# Patient Record
Sex: Male | Born: 1985 | Race: White | Hispanic: No | Smoking: Never smoker
Health system: Southern US, Community
[De-identification: ages and names within clinical notes are randomized; demographics above are authoritative.]

## PROBLEM LIST (undated history)

## (undated) DIAGNOSIS — J45909 Unspecified asthma, uncomplicated: Secondary | ICD-10-CM

---

## 2016-01-18 ENCOUNTER — Emergency Department (HOSPITAL_COMMUNITY)
Admission: EM | Admit: 2016-01-18 | Discharge: 2016-01-18 | Disposition: A | Discharge status: Home / Self Care | Payer: BLUE CROSS/BLUE SHIELD | Attending: Emergency Medicine | Admitting: Emergency Medicine

## 2016-01-18 ENCOUNTER — Encounter (HOSPITAL_COMMUNITY): Payer: Self-pay | Admitting: Emergency Medicine

## 2016-01-18 DIAGNOSIS — Z79899 Other long term (current) drug therapy: Secondary | ICD-10-CM | POA: Diagnosis not present

## 2016-01-18 DIAGNOSIS — Y9367 Activity, basketball: Secondary | ICD-10-CM | POA: Insufficient documentation

## 2016-01-18 DIAGNOSIS — Y92310 Basketball court as the place of occurrence of the external cause: Secondary | ICD-10-CM | POA: Diagnosis not present

## 2016-01-18 DIAGNOSIS — S0501XA Injury of conjunctiva and corneal abrasion without foreign body, right eye, initial encounter: Secondary | ICD-10-CM

## 2016-01-18 DIAGNOSIS — J45909 Unspecified asthma, uncomplicated: Secondary | ICD-10-CM | POA: Insufficient documentation

## 2016-01-18 DIAGNOSIS — H1131 Conjunctival hemorrhage, right eye: Secondary | ICD-10-CM | POA: Diagnosis not present

## 2016-01-18 DIAGNOSIS — S0511XA Contusion of eyeball and orbital tissues, right eye, initial encounter: Secondary | ICD-10-CM | POA: Diagnosis not present

## 2016-01-18 DIAGNOSIS — Y998 Other external cause status: Secondary | ICD-10-CM | POA: Diagnosis not present

## 2016-01-18 DIAGNOSIS — W228XXA Striking against or struck by other objects, initial encounter: Secondary | ICD-10-CM | POA: Diagnosis not present

## 2016-01-18 DIAGNOSIS — S0591XA Unspecified injury of right eye and orbit, initial encounter: Secondary | ICD-10-CM | POA: Diagnosis present

## 2016-01-18 HISTORY — DX: Unspecified asthma, uncomplicated: J45.909

## 2016-01-18 MED ORDER — TETRACAINE HCL 0.5 % OP SOLN
2.0000 [drp] | Freq: Once | OPHTHALMIC | Status: AC
  Administered 2016-01-18: 2 [drp] via OPHTHALMIC
  Filled 2016-01-18: qty 2

## 2016-01-18 MED ORDER — IBUPROFEN 800 MG PO TABS: 800.0000 mg | ORAL_TABLET | Freq: Three times a day (TID) | ORAL | Status: AC

## 2016-01-18 MED ORDER — FLUORESCEIN SODIUM 1 MG OP STRP
1.0000 | ORAL_STRIP | Freq: Once | OPHTHALMIC | Status: AC
  Administered 2016-01-18: 1 via OPHTHALMIC
  Filled 2016-01-18: qty 1

## 2016-01-18 MED ORDER — ERYTHROMYCIN 5 MG/GM OP OINT: TOPICAL_OINTMENT | OPHTHALMIC | Status: AC

## 2016-01-18 NOTE — Discharge Instructions (Signed)
Please call Dr. Darrol Poke office to schedule a follow up appointment as soon as possible within the next 24-48 hours. Use the antibiotic ointment as directed. You may take ibuprofen as needed for pain. Return to the ER for new or worsening symptoms.   Corneal Abrasion The cornea is the clear covering at the front and center of the eye. When looking at the colored portion of the eye (iris), you are looking through the cornea. This very thin tissue is made up of many layers. The surface layer is a single layer of cells (corneal epithelium) and is one of the most sensitive tissues in the body. If a scratch or injury causes the corneal epithelium to come off, it is called a corneal abrasion. If the injury extends to the tissues below the epithelium, the condition is called a corneal ulcer. CAUSES   Scratches.  Trauma.  Foreign body in the eye. Some people have recurrences of abrasions in the area of the original injury even after it has healed (recurrent erosion syndrome). Recurrent erosion syndrome generally improves and goes away with time. SYMPTOMS   Eye pain.  Difficulty or inability to keep the injured eye open.  The eye becomes very sensitive to light.  Recurrent erosions tend to happen suddenly, first thing in the morning, usually after waking up and opening the eye. DIAGNOSIS  Your health care provider can diagnose a corneal abrasion during an eye exam. Dye is usually placed in the eye using a drop or a small paper strip moistened by your tears. When the eye is examined with a special light, the abrasion shows up clearly because of the dye. TREATMENT   Small abrasions may be treated with antibiotic drops or ointment alone.  A pressure patch may be put over the eye. If this is done, follow your doctor's instructions for when to remove the patch. Do not drive or use machines while the eye patch is on. Judging distances is hard to do with a patch on. If the abrasion becomes infected and  spreads to the deeper tissues of the cornea, a corneal ulcer can result. This is serious because it can cause corneal scarring. Corneal scars interfere with light passing through the cornea and cause a loss of vision in the involved eye. HOME CARE INSTRUCTIONS  Use medicine or ointment as directed. Only take over-the-counter or prescription medicines for pain, discomfort, or fever as directed by your health care provider.  Do not drive or operate machinery if your eye is patched. Your ability to judge distances is impaired.  If your health care provider has given you a follow-up appointment, it is very important to keep that appointment. Not keeping the appointment could result in a severe eye infection or permanent loss of vision. If there is any problem keeping the appointment, let your health care provider know. SEEK MEDICAL CARE IF:   You have pain, light sensitivity, and a scratchy feeling in one eye or both eyes.  Your pressure patch keeps loosening up, and you can blink your eye under the patch after treatment.  Any kind of discharge develops from the eye after treatment or if the lids stick together in the morning.  You have the same symptoms in the morning as you did with the original abrasion days, weeks, or months after the abrasion healed.   This information is not intended to replace advice given to you by your health care provider. Make sure you discuss any questions you have with your health care  provider.   Document Released: 10/11/2000 Document Revised: 07/05/2015 Document Reviewed: 06/21/2013 Elsevier Interactive Patient Education 2016 Elsevier Inc.  Subconjunctival Hemorrhage Subconjunctival hemorrhage is bleeding that happens between the white part of your eye (sclera) and the clear membrane that covers the outside of your eye (conjunctiva). There are many tiny blood vessels near the surface of your eye. A subconjunctival hemorrhage happens when one or more of these  vessels breaks and bleeds, causing a red patch to appear on your eye. This is similar to a bruise. Depending on the amount of bleeding, the red patch may only cover a small area of your eye or it may cover the entire visible part of the sclera. If a lot of blood collects under the conjunctiva, there may also be swelling. Subconjunctival hemorrhages do not affect your vision or cause pain, but your eye may feel irritated if there is swelling. Subconjunctival hemorrhages usually do not require treatment, and they disappear on their own within two weeks. CAUSES This condition may be caused by:  Mild trauma, such as rubbing your eye too hard.  Severe trauma or blunt injuries.  Coughing, sneezing, or vomiting.  Straining, such as when lifting a heavy object.  High blood pressure.  Recent eye surgery.  A history of diabetes.  Certain medicines, especially blood thinners (anticoagulants).  Other conditions, such as eye tumors, bleeding disorders, or blood vessel abnormalities. Subconjunctival hemorrhages can happen without an obvious cause.  SYMPTOMS  Symptoms of this condition include:  A bright red or dark red patch on the white part of the eye.  The red area may spread out to cover a larger area of the eye before it goes away.  The red area may turn brownish-yellow before it goes away.  Swelling.  Mild eye irritation. DIAGNOSIS This condition is diagnosed with a physical exam. If your subconjunctival hemorrhage was caused by trauma, your health care provider may refer you to an eye specialist (ophthalmologist) or another specialist to check for other injuries. You may have other tests, including:  An eye exam.  A blood pressure check.  Blood tests to check for bleeding disorders. If your subconjunctival hemorrhage was caused by trauma, X-rays or a CT scan may be done to check for other injuries. TREATMENT Usually, no treatment is needed. Your health care provider may  recommend eye drops or cold compresses to help with discomfort. HOME CARE INSTRUCTIONS  Take over-the-counter and prescription medicines only as directed by your health care provider.  Use eye drops or cold compresses to help with discomfort as directed by your health care provider.  Avoid activities, things, and environments that may irritate or injure your eye.  Keep all follow-up visits as told by your health care provider. This is important. SEEK MEDICAL CARE IF:  You have pain in your eye.  The bleeding does not go away within 3 weeks.  You keep getting new subconjunctival hemorrhages. SEEK IMMEDIATE MEDICAL CARE IF:  Your vision changes or you have difficulty seeing.  You suddenly develop severe sensitivity to light.  You develop a severe headache, persistent vomiting, confusion, or abnormal tiredness (lethargy).  Your eye seems to bulge or protrude from your eye socket.  You develop unexplained bruises on your body.  You have unexplained bleeding in another area of your body.   This information is not intended to replace advice given to you by your health care provider. Make sure you discuss any questions you have with your health care provider.   Document  Released: 10/14/2005 Document Revised: 07/05/2015 Document Reviewed: 12/21/2014 Elsevier Interactive Patient Education [employer]2016 Elsevier Inc.

## 2016-01-18 NOTE — ED Provider Notes (Signed)
CSN: [id number]     Arrival date & time 01/18/16  2132 History  By signing my name below, I, Doreatha Martin, attest that this documentation has been prepared under the direction and in the presence of Kainan Patty Y Aradhya Shellenbarger, [place]. Electronically Signed: Doreatha Martin, ED Scribe. 01/18/2016. 9:46 PM.     Chief Complaint  Patient presents with  . Eye Injury   The history is provided by the patient. No language interpreter was used.   HPI Comments: Kerry Hall is a 30 y.o. male who presents to the Emergency Department complaining of moderate right eye pain and eyelid swelling onset this evening s/p injury with associated blurred vision, wateriness. Per pt, he was playing basketball when he accidentally hit his eye with thumb. Pt states he has throbbing pain with ocular movement. Pt attributes his blurred vision to his tearing. He does not wear contacts or glasses. Pt denies taking OTC medications at home to improve symptoms. He denies additional injury.  Past Medical History  Diagnosis Date  . Asthma    History reviewed. No pertinent past surgical history. No family history on file. Social History  Substance Use Topics  . Smoking status: Never Smoker   . Smokeless tobacco: None  . Alcohol Use: Yes    Review of Systems  Eyes: Positive for pain, discharge and visual disturbance.  All other systems reviewed and are negative.  Allergies  Review of patient's allergies indicates no known allergies.  Home Medications   Prior to Admission medications   Medication Sig Start Date End Date Taking? Authorizing Provider  omeprazole (PRILOSEC) 40 MG capsule Take 40 mg by mouth daily.   Yes Historical Provider, MD   BP 115/74 mmHg  Pulse 97  Temp(Src) 98.7 F (37.1 C) (Oral)  Resp 16  Ht  (1.88 m)  Wt 179 lb (81.194 kg)  BMI 22.97 kg/m2  SpO2 100% Physical Exam  Constitutional: He is oriented to person, place, and time. He appears well-developed and well-nourished.  HENT:  Head:  Normocephalic and atraumatic.  Eyes: Conjunctivae and EOM are normal. Pupils are equal, round, and reactive to light.  Slit lamp exam:      The right eye shows corneal abrasion.    Three corneal abrasions transversing anterior aspect of right eye. One crosses the pupil.  Right subconjunctival hemorrhage  Neck: Normal range of motion. Neck supple.  Cardiovascular: Normal rate.   Pulmonary/Chest: Effort normal. No respiratory distress.  Abdominal: He exhibits no distension.  Musculoskeletal: Normal range of motion.  Neurological: He is alert and oriented to person, place, and time.  Skin: Skin is warm and dry.  Psychiatric: He has a normal mood and affect. His behavior is normal.  Nursing note and vitals reviewed.    Visual Acuity  Right Eye Distance:   Left Eye Distance:   Bilateral Distance:    Right Eye Near: R Near: 20/20 Left Eye Near:  L Near: 20/20 Bilateral Near:  20/20   ED Course  Procedures (including critical care time) DIAGNOSTIC STUDIES: Oxygen Saturation is 100% on RA, normal by my interpretation.    COORDINATION OF CARE: 9:45 PM Discussed treatment plan with pt at bedside which includes symptomatic treatment and pt agreed to plan.    MDM   Final diagnoses:  Corneal abrasion, right, initial encounter  Contusion of right eye, initial encounter  Subconjunctival hemorrhage of right eye    Pt with corneal abrasion and subconjunctival hemorrhage on exam. Visual acuity and EOM intact. Rx given  for erythromycin ointment. Instructed to call ophtho to schedule f/u ASAP within next 24-48h for re-eval. Ibuprofen as needed for pain. ER return precautions given. Pt verbalized his agreement and understanding with plan.  I personally performed the services described in this documentation, which was scribed in my presence. The recorded information has been reviewed and is accurate.   Carlene CoriaSerena Y Natavia Sublette, PA-C 01/18/16 2255  Loren Raceravid Yelverton, MD 01/19/16 [id number]

## 2016-01-18 NOTE — ED Notes (Signed)
Pt. accidentally hit his right eye with his finger while playing basketball this evening , reports pain with redness/mild swelling , no vision loss .

## 2016-04-17 ENCOUNTER — Other Ambulatory Visit: Payer: Self-pay | Admitting: Otolaryngology

## 2016-04-17 DIAGNOSIS — K219 Gastro-esophageal reflux disease without esophagitis: Secondary | ICD-10-CM

## 2016-04-19 ENCOUNTER — Ambulatory Visit
Admission: RE | Admit: 2016-04-19 | Discharge: 2016-04-19 | Disposition: A | Discharge status: Home / Self Care | Payer: BC Managed Care – PPO | Source: Ambulatory Visit | Attending: Otolaryngology | Admitting: Otolaryngology

## 2016-04-19 DIAGNOSIS — K219 Gastro-esophageal reflux disease without esophagitis: Secondary | ICD-10-CM

## 2016-05-08 ENCOUNTER — Other Ambulatory Visit: Payer: Self-pay | Admitting: Otolaryngology

## 2016-05-08 DIAGNOSIS — K2100 Gastro-esophageal reflux disease with esophagitis, without bleeding: Secondary | ICD-10-CM

## 2016-05-15 ENCOUNTER — Ambulatory Visit
Admission: RE | Admit: 2016-05-15 | Discharge: 2016-05-15 | Disposition: A | Discharge status: Home / Self Care | Payer: BC Managed Care – PPO | Source: Ambulatory Visit | Attending: Otolaryngology | Admitting: Otolaryngology

## 2016-05-15 DIAGNOSIS — K2100 Gastro-esophageal reflux disease with esophagitis, without bleeding: Secondary | ICD-10-CM

## 2016-05-15 MED ORDER — IOPAMIDOL (ISOVUE-300) INJECTION 61%
75.0000 mL | Freq: Once | INTRAVENOUS | Status: AC | PRN
  Administered 2016-05-15: 75 mL via INTRAVENOUS

## 2016-10-12 IMAGING — CT CT NECK W/ CM
4 of 5 series · 16 of 33 positions shown, 19 images · IV contrast (75CC ISOVUE 300)
Comparison: None.

CLINICAL DATA: Gastroesophageal reflux disease with esophagitis.
Dysphasia.

EXAM:
CT NECK WITH CONTRAST
TECHNIQUE: Multidetector CT imaging of the neck was performed using the
standard protocol following the bolus administration of intravenous
contrast.
CONTRAST:  75mL 1KLEE4-6DD IOPAMIDOL (1KLEE4-6DD) INJECTION 61%

[Series 3: axial neck · axial · 0.45mm/px · z∈[-55,+55]mm · 3 of 112 slices shown]
[im 23/112  bone]
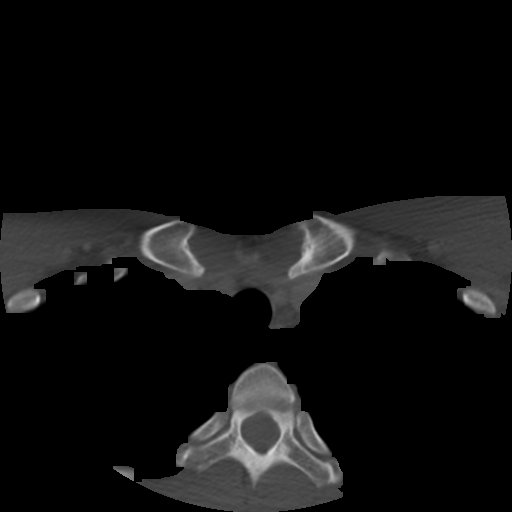
[im 45/112  bone]
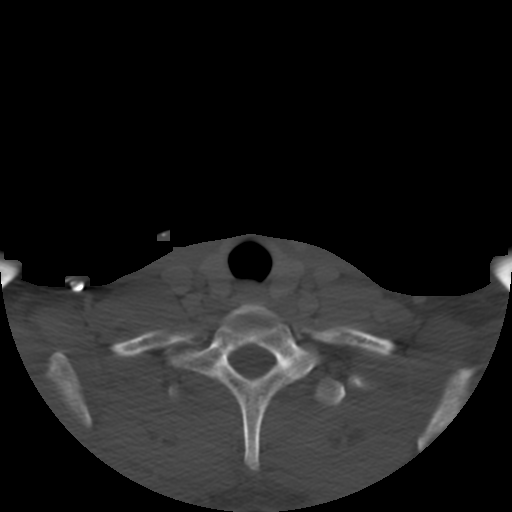
[im 67/112  bone]
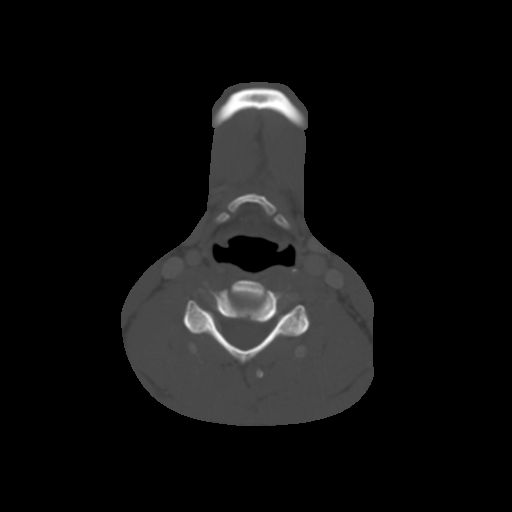

[Series 200: cor · coronal · 0.54mm/px · 3 of 112 slices shown]
[im 23/112  bone]
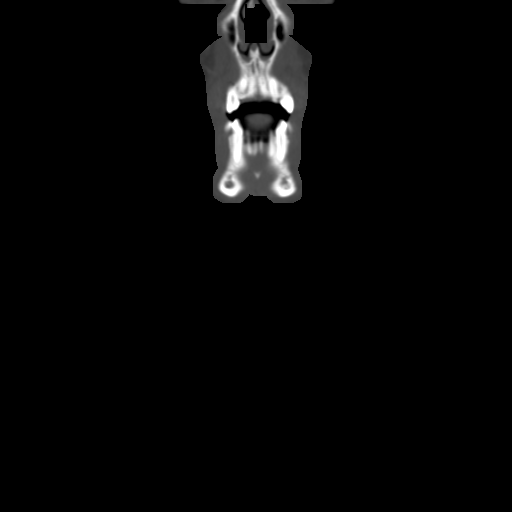
[im 45/112  bone]
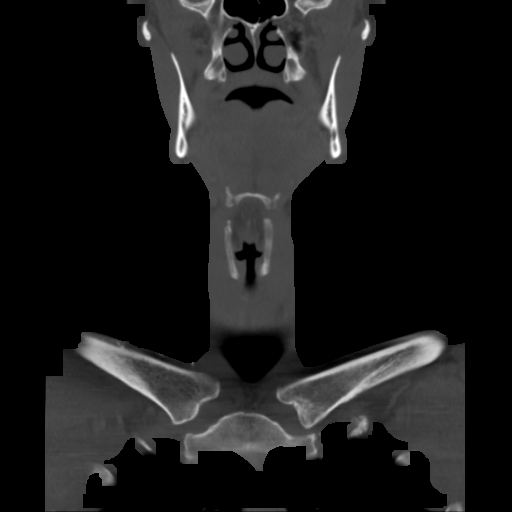
[im 67/112  bone]
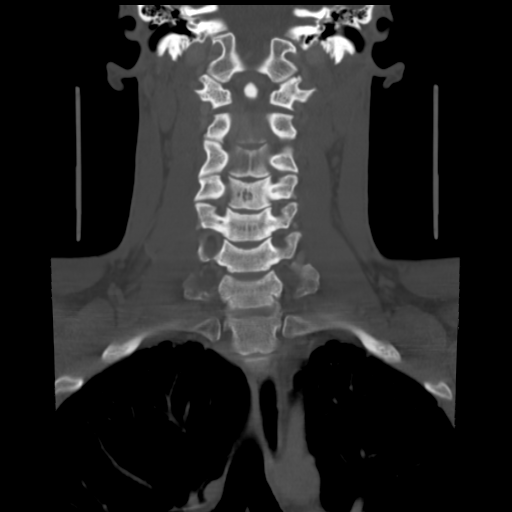

[Series 201: angled axial · axial · 0.54mm/px · z∈[-100,+98]mm · 5 of 153 slices shown, 7 images]
[im 26/153  soft-tissue]
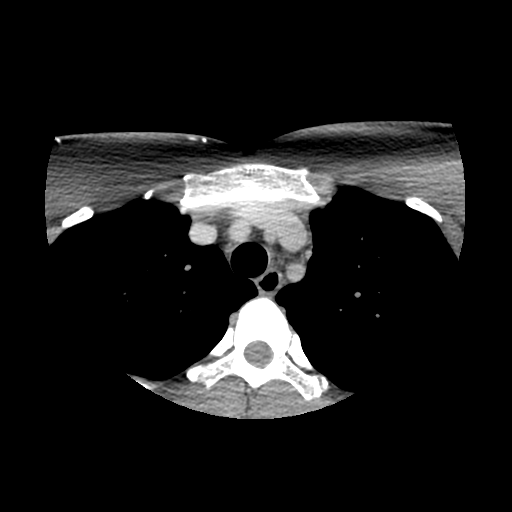
[im 26/153  bone]
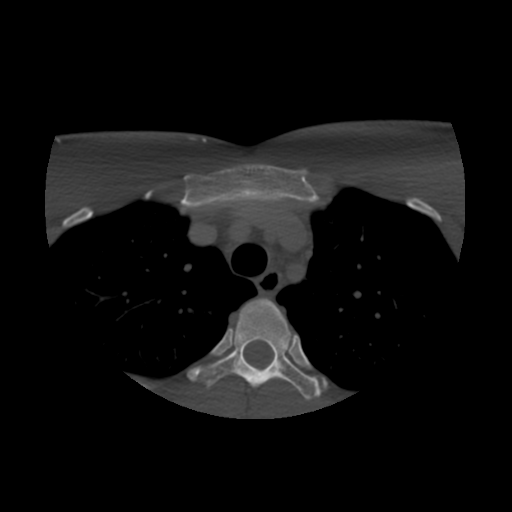
[im 51/153  bone]
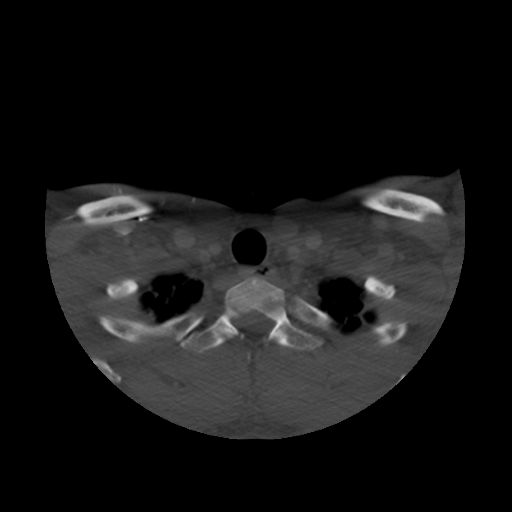
[im 77/153  bone]
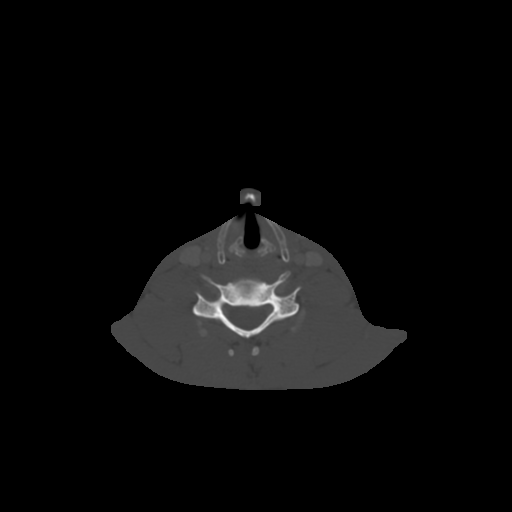
[im 102/153  bone]
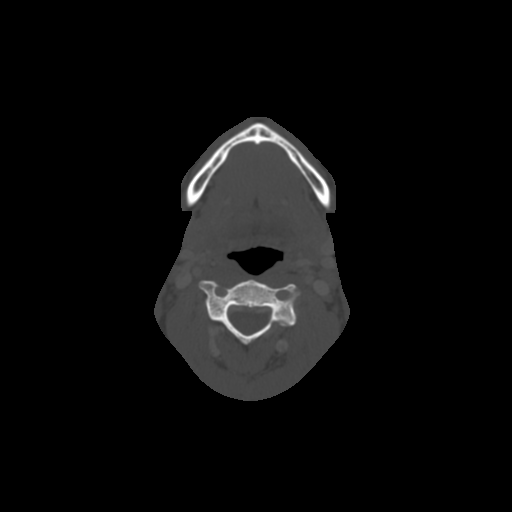
[im 127/153  soft-tissue]
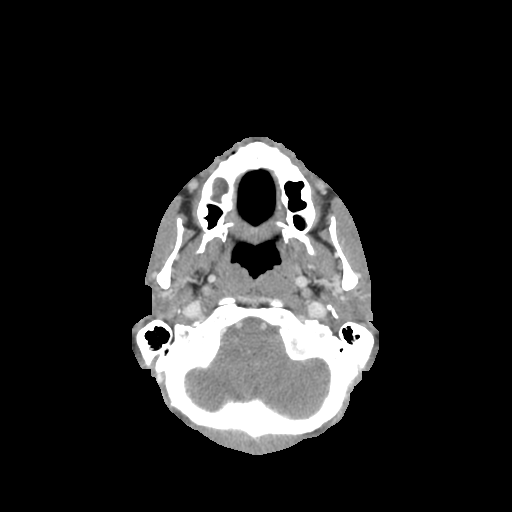
[im 127/153  bone]
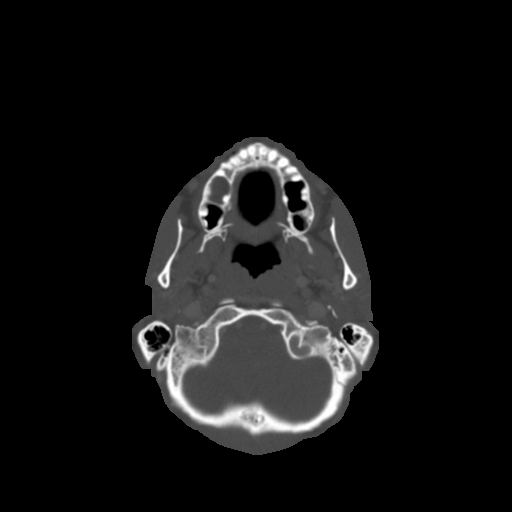

[Series 202: sag · sagittal · 0.54mm/px · 5 of 115 slices shown, 6 images]
[im 39/115  bone]
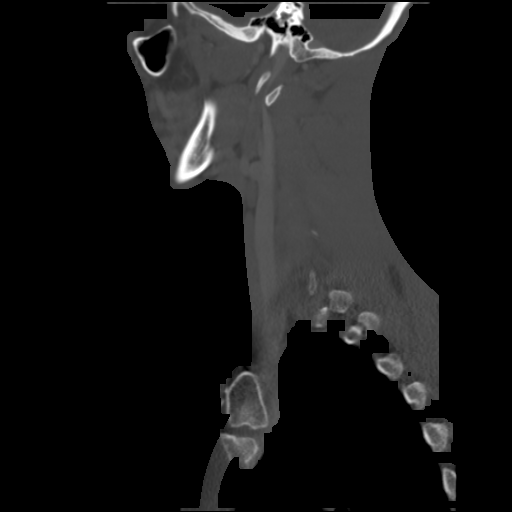
[im 48/115  bone]
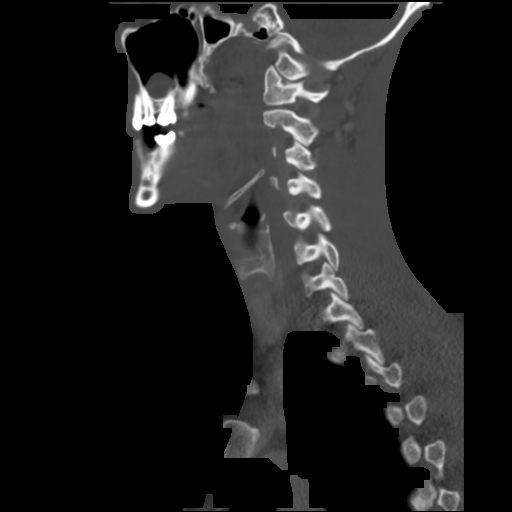
[im 58/115  soft-tissue]
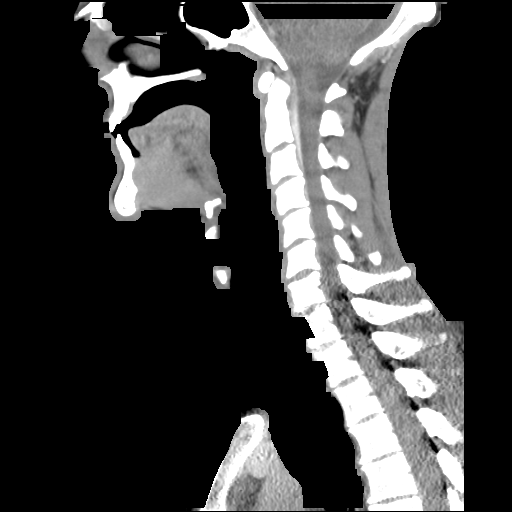
[im 58/115  bone]
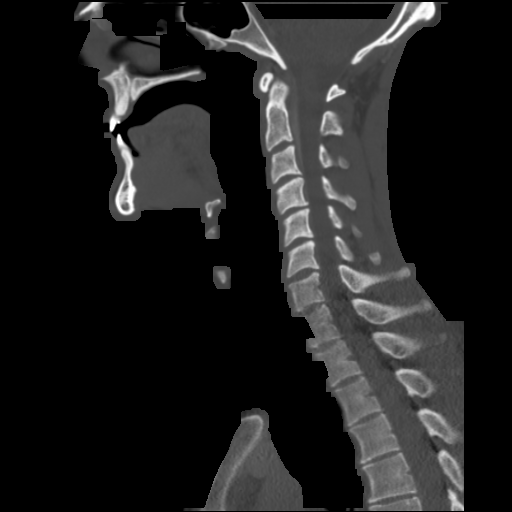
[im 67/115  bone]
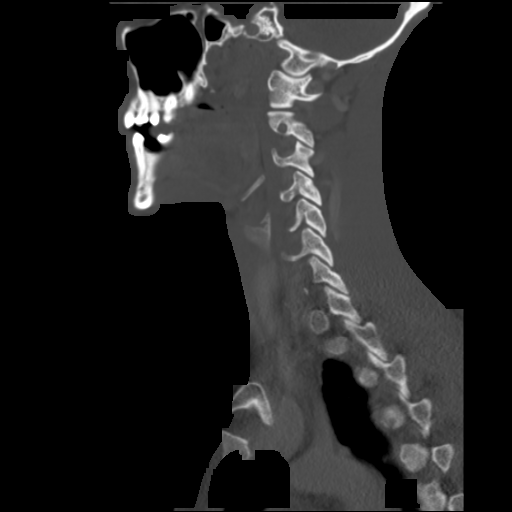
[im 77/115  bone]
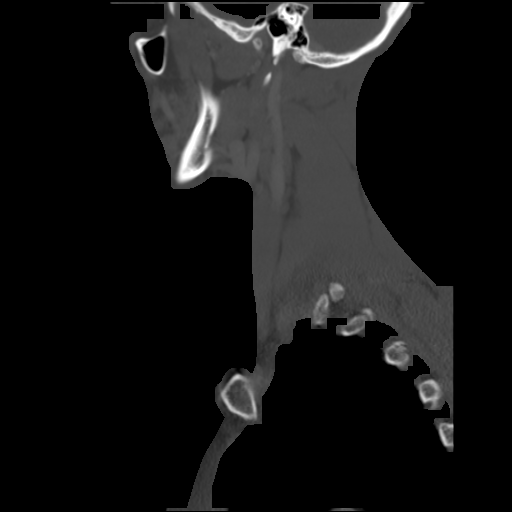

[16 of 33 positions shown; findings below may reference images not displayed]

FINDINGS: Pharynx and larynx: The nasopharynx is clear. The oropharynx and
hypopharynx are normal. The epiglottis is normal. The supraglottic
larynx, glottis and subglottic larynx are normal.

Salivary glands: The parotid and submandibular glands are normal. No
sialolithiasis or salivary ductal dilatation.

Thyroid: Normal.

Lymph nodes: No cervical lymphadenopathy.

Vascular: Normal

Limited intracranial: Normal

Visualized orbits: Normal.

Mastoids and visualized paranasal sinuses: Small right maxillary
retention cyst, in close proximity to the root of 1 of the right
molars.

Skeleton: Normal

Upper chest: Normal
IMPRESSION: 1. Normal neck CT.
2. No focal abnormality to explain the reported dysphagia.

## 2019-11-10 DIAGNOSIS — Z20828 Contact with and (suspected) exposure to other viral communicable diseases: Secondary | ICD-10-CM | POA: Diagnosis not present

## 2019-11-25 DIAGNOSIS — L309 Dermatitis, unspecified: Secondary | ICD-10-CM | POA: Diagnosis not present

## 2019-11-25 DIAGNOSIS — D224 Melanocytic nevi of scalp and neck: Secondary | ICD-10-CM | POA: Diagnosis not present

## 2019-11-25 DIAGNOSIS — D485 Neoplasm of uncertain behavior of skin: Secondary | ICD-10-CM | POA: Diagnosis not present

## 2019-11-25 DIAGNOSIS — D225 Melanocytic nevi of trunk: Secondary | ICD-10-CM | POA: Diagnosis not present

## 2019-11-25 DIAGNOSIS — D2262 Melanocytic nevi of left upper limb, including shoulder: Secondary | ICD-10-CM | POA: Diagnosis not present

## 2019-12-28 DIAGNOSIS — Z8249 Family history of ischemic heart disease and other diseases of the circulatory system: Secondary | ICD-10-CM | POA: Diagnosis not present

## 2019-12-28 DIAGNOSIS — Z0000 Encounter for general adult medical examination without abnormal findings: Secondary | ICD-10-CM | POA: Diagnosis not present

## 2024-04-13 ENCOUNTER — Inpatient Hospital Stay: Payer: Self-pay

## 2024-04-13 ENCOUNTER — Inpatient Hospital Stay: Payer: Self-pay | Attending: Internal Medicine | Admitting: Genetic Counselor

## 2024-04-13 ENCOUNTER — Encounter: Payer: Self-pay | Admitting: Genetic Counselor

## 2024-04-13 ENCOUNTER — Other Ambulatory Visit: Payer: Self-pay | Admitting: Genetic Counselor

## 2024-04-13 DIAGNOSIS — Z1379 Encounter for other screening for genetic and chromosomal anomalies: Secondary | ICD-10-CM

## 2024-04-13 DIAGNOSIS — Z8041 Family history of malignant neoplasm of ovary: Secondary | ICD-10-CM

## 2024-04-13 DIAGNOSIS — Z800 Family history of malignant neoplasm of digestive organs: Secondary | ICD-10-CM

## 2024-04-13 LAB — GENETIC SCREENING ORDER

## 2024-04-13 NOTE — Progress Notes (Signed)
 REFERRING PROVIDER: Perley Bradley, MD [address] [place],  [place] [address]  PRIMARY PROVIDER:  No primary care provider on file.  PRIMARY REASON FOR VISIT:  1. Family history of Lynch syndrome   2. Family history of ovarian cancer    HISTORY OF PRESENT ILLNESS:   Kerry Hall, a 38 y.o. male, was seen for a [facility] cancer genetics consultation at the request of Dr. Annabell Key due to a family history of Lynch Syndrome.  Kerry Hall presents to clinic today to discuss the possibility of a hereditary predisposition to cancer, to discuss genetic testing, and to further clarify his future cancer risks, as well as potential cancer risks for family members.   Kerry Hall is a 38 y.o. male with no personal history of cancer.    Past Medical History:  Diagnosis Date   Asthma     Social History   Socioeconomic History   Marital status: Unknown    Spouse name: Not on file   Number of children: Not on file   Years of education: Not on file   Highest education level: Not on file  Occupational History   Not on file  Tobacco Use   Smoking status: Never   Smokeless tobacco: Not on file  Substance and Sexual Activity   Alcohol use: Yes   Drug use: No   Sexual activity: Not on file  Other Topics Concern   Not on file  Social History Narrative   Not on file   Social Drivers of Health   Financial Resource Strain: Not on file  Food Insecurity: Not on file  Transportation Needs: Not on file  Physical Activity: Not on file  Stress: Not on file  Social Connections: Not on file     FAMILY HISTORY:  We obtained a detailed, 4-generation family history.  Significant diagnoses are listed below: Family History  Problem Relation Age of Onset   Other Mother 50       benign brain tumor   Ovarian cancer Sister 76 - 75       lynch syndrome (reportedly)   Lung cancer Maternal Aunt 93 - 58       smoked   Lung cancer Paternal Grandmother 75 - 22       nonsmoker   Other Niece         childhood brain tumor     Kerry Hall reports his sister was diagnosed with Lynch Syndrome but he does not have a copy of her report nor does he know what gene she tested positive in. There is no reported Ashkenazi Jewish ancestry.  GENETIC COUNSELING ASSESSMENT: Kerry Hall is a 38 y.o. male with a family history of cancer which is somewhat suggestive of a hereditary predisposition to cancer given his sister's reported Lynch Syndrome diagnosis. We, therefore, discussed and recommended the following at today's visit.   DISCUSSION: We discussed that 5 - 10% of cancer is hereditary, with some cases of hereditary ovarian cancer associated with Lynch Syndrome.  There are other genes that can be associated with hereditary ovarian cancer syndromes.  We discussed that testing is beneficial for several reasons, including knowing about other cancer risks, identifying potential screening and risk-reduction options that may be appropriate, and to understanding if other family members could be at risk for cancer and allowing them to undergo genetic testing.  We spent the majority of today's session reviewing Lynch Syndrome with MLH1 as the example. We also reviewed the characteristics, features and inheritance patterns of  hereditary cancer syndromes. We also discussed genetic testing, including the appropriate family members to test, the process of testing, insurance coverage and turn-around-time for results. We discussed the implications of a negative, positive, carrier and/or variant of uncertain significant result. We recommended Kerry Hall pursue genetic testing for a panel that contains genes associated with Lynch Syndrome and common cancers since we do not have a copy of her report.  Kerry Hall was offered a common hereditary cancer panel (40 genes) and an expanded pan-cancer panel (77 genes). Kerry Hall was informed of the benefits and limitations of each panel, including that expanded pan-cancer  panels contain several genes that do not have clear management guidelines at this point in time.  We also discussed that as the number of genes included on a panel increases, the chances of variants of uncertain significance increases.  After considering the benefits and limitations of each gene panel, Kerry Hall elected to have [facility] CancerNext-Expanded Panel.  The CancerNext-Expanded gene panel offered by [facility] and includes sequencing, rearrangement, and RNA analysis for the following 77 genes: AIP, ALK, APC, ATM, AXIN2, BAP1, BARD1, BMPR1A, BRCA1, BRCA2, BRIP1, CDC73, CDH1, CDK4, CDKN1B, CDKN2A, CEBPA, CHEK2, CTNNA1, DDX41, DICER1, ETV6, FH, FLCN, GATA2, LZTR1, MAX, MBD4, MEN1, MET, MLH1, MSH2, MSH3, MSH6, MUTYH, NF1, NF2, NTHL1, PALB2, PHOX2B, PMS2, POT1, PRKAR1A, PTCH1, PTEN, RAD51C, RAD51D, RB1, RET, RPS20, RUNX1, SDHA, SDHAF2, SDHB, SDHC, SDHD, SMAD4, SMARCA4, SMARCB1, SMARCE1, STK11, SUFU, TMEM127, TP53, TSC1, TSC2, VHL, and WT1 (sequencing and deletion/duplication); EGFR, HOXB13, KIT, MITF, PDGFRA, POLD1, and POLE (sequencing only); EPCAM and GREM1 (deletion/duplication only).     Based on Kerry Hall's family history of cancer, he meets medical criteria for genetic testing. Despite that he meets criteria, he may still have an out of pocket cost. We discussed that if his out of pocket cost for testing is over $100, the laboratory should contact them to discuss self-pay prices, patient pay assistance programs, if applicable, and other billing options.  We discussed that some people do not want to undergo genetic testing due to fear of genetic discrimination.  A federal law called the Genetic Information Non-Discrimination Act (GINA) of 2008 helps protect individuals against genetic discrimination based on their genetic test results.  It impacts both health insurance and employment.  With health insurance, it protects against increased premiums, being kicked off insurance or being forced to  take a test in order to be insured.  For employment it protects against hiring, firing and promoting decisions based on genetic test results.  GINA does not apply to those in the [employer], those who work for companies with less than 15 employees, and new life insurance or long-term disability insurance policies.  Health status due to a cancer diagnosis is not protected under GINA.  PLAN: After considering the risks, benefits, and limitations, Kerry Hall provided informed consent to pursue genetic testing and the blood sample was sent to [facility] for analysis of the CancerNext-Expanded Panel. Results should be available within approximately 2-3 weeks' time, at which point they will be disclosed by telephone to Kerry Hall, as will any additional recommendations warranted by these results. Kerry Hall will receive a summary of his genetic counseling visit and a copy of his results once available. This information will also be available in Epic.   Kerry Hall questions were answered to his satisfaction today. Our contact information was provided should additional questions or concerns arise. Thank you for the referral and allowing us  to share in the care of  your patient.   Allisyn Kunz, MS, [facility] Genetic Counselor [email or web address] (P) [phone]  70 minutes were spent on the date of the encounter in service to the patient including preparation, face-to-face consultation, documentation and care coordination. The patient was seen alone.  Drs. Gudena and/or Maryalice Smaller were available to discuss this case as needed.  _______________________________________________________________________ For Office Staff:  Number of people involved in session: 1 Was an Intern/ student involved with case: yes

## 2024-04-26 ENCOUNTER — Ambulatory Visit: Payer: Self-pay | Admitting: Genetic Counselor

## 2024-04-26 ENCOUNTER — Encounter: Payer: Self-pay | Admitting: Genetic Counselor

## 2024-04-26 ENCOUNTER — Telehealth: Payer: Self-pay | Admitting: Genetic Counselor

## 2024-04-26 DIAGNOSIS — Z1379 Encounter for other screening for genetic and chromosomal anomalies: Secondary | ICD-10-CM

## 2024-04-26 NOTE — Telephone Encounter (Signed)
 I contacted Mr. Delavega to discuss his genetic testing results. No pathogenic variants were identified in the 77 genes analyzed. Detailed clinic note to follow.  The test report has been scanned into EPIC and is located under the Molecular Pathology section of the Results Review tab.  A portion of the result report is included below for reference.   Laurell Coalson, MS, [facility] Genetic Counselor [email or web address] (P) [phone]

## 2024-04-26 NOTE — Progress Notes (Signed)
 HPI:   [name] was previously seen in the [facility] due to a family history of Lynch Syndrome and concerns regarding a hereditary predisposition to cancer. Please refer to our prior cancer genetics clinic note for more information regarding our discussion, assessment and recommendations, at the time. [name] recent genetic test results were disclosed to him, as were recommendations warranted by these results. These results and recommendations are discussed in more detail below.  CANCER HISTORY:  Oncology History   No history exists.    FAMILY HISTORY:  We obtained a detailed, 4-generation family history.  Significant diagnoses are listed below:      Family History  Problem Relation Age of Onset   Other Mother 77        benign brain tumor   Ovarian cancer Sister 78 - 77        lynch syndrome (reportedly)   Lung cancer Maternal Aunt 31 - 58        smoked   Lung cancer Paternal Grandmother 87 - 49        nonsmoker   Other Niece          childhood brain tumor           [name] reports his sister was diagnosed with Lynch Syndrome but he does not have a copy of her report nor does he know what gene she tested positive in. There is no reported Ashkenazi Jewish ancestry.  GENETIC TEST RESULTS:  The [facility] CancerNext-Expanded Panel found no pathogenic mutations.  The CancerNext-Expanded gene panel offered by [facility] and includes sequencing, rearrangement, and RNA analysis for the following 77 genes: AIP, ALK, APC, ATM, AXIN2, BAP1, BARD1, BMPR1A, BRCA1, BRCA2, BRIP1, CDC73, CDH1, CDK4, CDKN1B, CDKN2A, CEBPA, CHEK2, CTNNA1, DDX41, DICER1, ETV6, FH, FLCN, GATA2, LZTR1, MAX, MBD4, MEN1, MET, MLH1, MSH2, MSH3, MSH6, MUTYH, NF1, NF2, NTHL1, PALB2, PHOX2B, PMS2, POT1, PRKAR1A, PTCH1, PTEN, RAD51C, RAD51D, RB1, RET, RPS20, RUNX1, SDHA, SDHAF2, SDHB, SDHC, SDHD, SMAD4, SMARCA4, SMARCB1, SMARCE1, STK11, SUFU, TMEM127, TP53, TSC1, TSC2, VHL, and WT1  (sequencing and deletion/duplication); EGFR, HOXB13, KIT, MITF, PDGFRA, POLD1, and POLE (sequencing only); EPCAM and GREM1 (deletion/duplication only).   The test report has been scanned into EPIC and is located under the Molecular Pathology section of the Results Review tab.  A portion of the result report is included below for reference. Genetic testing reported out on 04/21/2024.       Even though a pathogenic variant was not identified, possible explanations for the cancer in the family may include: There may be no hereditary risk for cancer in the family. The cancers in [name] and/or his family may be due to other genetic or environmental factors. There may be a gene mutation in one of these genes that current testing methods cannot detect, but that chance is small. There could be another gene that has not yet been discovered, or that we have not yet tested, that is responsible for the cancer diagnoses in the family.  It is also possible there is a hereditary cause for the cancer in the family that [name] did not inherit. This is the most likely case scenario considering he reports his sister has Lynch Syndrome.   Therefore, it is important to remain in touch with cancer genetics in the future so that we can continue to offer [name] the most up to date genetic testing.   ADDITIONAL GENETIC TESTING:  We discussed with [name] that his genetic  testing was fairly extensive.  If there are genes identified to increase cancer risk that can be analyzed in the future, we would be happy to discuss and coordinate this testing at that time.    CANCER SCREENING RECOMMENDATIONS:  [name] test result is considered negative (normal). An individual's cancer risk and medical management are not determined by genetic test results alone. Overall cancer risk assessment incorporates additional factors, including personal medical history, family history, and any available genetic  information that may result in a personalized plan for cancer prevention and surveillance. Therefore, it is recommended he continue to follow the cancer management and screening guidelines provided by his primary healthcare provider.  RECOMMENDATIONS FOR FAMILY MEMBERS:   Other members of the family may still carry a pathogenic variant in one of these genes that [name] did not inherit. Based on the family history, we recommend his mother have genetic counseling and testing.   FOLLOW-UP:  Cancer genetics is a rapidly advancing field and it is possible that new genetic tests will be appropriate for him and/or his family members in the future. We encouraged him to remain in contact with cancer genetics on an annual basis so we can update his personal and family histories and let him know of advances in cancer genetics that may benefit this family.   Our contact number was provided. [name] questions were answered to his satisfaction, and he knows he is welcome to call us  at anytime with additional questions or concerns.   Kerry Kueker, MS, [facility] Counsel[facility] [email or web address] (P) [phone]
